# Patient Record
Sex: Male | Born: 1965
Health system: Southern US, Community
[De-identification: ages and names within clinical notes are randomized; demographics above are authoritative.]

## PROBLEM LIST (undated history)

## (undated) DIAGNOSIS — K2210 Ulcer of esophagus without bleeding: Secondary | ICD-10-CM

## (undated) DIAGNOSIS — E119 Type 2 diabetes mellitus without complications: Secondary | ICD-10-CM

## (undated) DIAGNOSIS — I10 Essential (primary) hypertension: Secondary | ICD-10-CM

## (undated) DIAGNOSIS — E7800 Pure hypercholesterolemia, unspecified: Secondary | ICD-10-CM

## (undated) HISTORY — DX: Ulcer of esophagus without bleeding: K22.10

## (undated) HISTORY — PX: HAND SURGERY: SHX662

---

## 2003-04-19 ENCOUNTER — Ambulatory Visit (HOSPITAL_COMMUNITY): Admission: RE | Admit: 2003-04-19 | Discharge: 2003-04-19 | Payer: Self-pay | Admitting: Family Medicine

## 2004-04-15 ENCOUNTER — Ambulatory Visit (HOSPITAL_COMMUNITY): Admission: RE | Admit: 2004-04-15 | Discharge: 2004-04-15 | Payer: Self-pay | Admitting: Family Medicine

## 2004-05-07 ENCOUNTER — Ambulatory Visit (HOSPITAL_COMMUNITY): Admission: RE | Admit: 2004-05-07 | Discharge: 2004-05-07 | Payer: Self-pay | Admitting: Urology

## 2004-05-09 ENCOUNTER — Ambulatory Visit (HOSPITAL_COMMUNITY): Admission: RE | Admit: 2004-05-09 | Discharge: 2004-05-09 | Payer: Self-pay | Admitting: Urology

## 2004-05-13 ENCOUNTER — Ambulatory Visit (HOSPITAL_COMMUNITY): Admission: RE | Admit: 2004-05-13 | Discharge: 2004-05-13 | Payer: Self-pay | Admitting: Urology

## 2004-05-28 ENCOUNTER — Ambulatory Visit (HOSPITAL_COMMUNITY): Admission: RE | Admit: 2004-05-28 | Discharge: 2004-05-28 | Payer: Self-pay | Admitting: Urology

## 2004-05-29 ENCOUNTER — Ambulatory Visit (HOSPITAL_COMMUNITY): Admission: RE | Admit: 2004-05-29 | Discharge: 2004-05-29 | Payer: Self-pay | Admitting: Urology

## 2004-07-01 ENCOUNTER — Ambulatory Visit (HOSPITAL_COMMUNITY): Admission: RE | Admit: 2004-07-01 | Discharge: 2004-07-01 | Payer: Self-pay | Admitting: Urology

## 2004-07-22 ENCOUNTER — Ambulatory Visit (HOSPITAL_COMMUNITY): Admission: RE | Admit: 2004-07-22 | Discharge: 2004-07-22 | Payer: Self-pay | Admitting: Urology

## 2006-07-06 ENCOUNTER — Ambulatory Visit (HOSPITAL_COMMUNITY): Admission: RE | Admit: 2006-07-06 | Discharge: 2006-07-06 | Payer: Self-pay | Admitting: Family Medicine

## 2007-04-15 ENCOUNTER — Ambulatory Visit (HOSPITAL_COMMUNITY): Admission: RE | Admit: 2007-04-15 | Discharge: 2007-04-15 | Payer: Self-pay | Admitting: Family Medicine

## 2009-03-08 ENCOUNTER — Ambulatory Visit (HOSPITAL_COMMUNITY): Admission: RE | Admit: 2009-03-08 | Discharge: 2009-03-08 | Payer: Self-pay | Admitting: Family Medicine

## 2009-12-30 ENCOUNTER — Ambulatory Visit: Payer: Self-pay | Admitting: Cardiology

## 2010-01-20 ENCOUNTER — Ambulatory Visit (HOSPITAL_COMMUNITY): Admission: RE | Admit: 2010-01-20 | Discharge: 2010-01-20 | Payer: Self-pay | Admitting: Family Medicine

## 2010-01-21 ENCOUNTER — Encounter (HOSPITAL_COMMUNITY): Admission: RE | Admit: 2010-01-21 | Discharge: 2010-02-20 | Payer: Self-pay | Admitting: Family Medicine

## 2010-05-03 ENCOUNTER — Encounter: Payer: Self-pay | Admitting: Urology

## 2010-08-29 NOTE — H&P (Signed)
Mario Wise               ACCOUNT NO.:  [id number]   MEDICAL RECORD NO.:  [id number]          PATIENT TYPE:  AMB   LOCATION:  DAY                           FACILITY:  [facility]   PHYSICIAN:  Dennie Maizes, M.D.   DATE OF BIRTH:  Sep 23, 1965   DATE OF ADMISSION:  05/28/2004  DATE OF DISCHARGE:  LH                                HISTORY & PHYSICAL   CHIEF COMPLAINT:  Intermittent gross hematuria, bilateral renal calculi,  large right renal calculus.   HISTORY OF PRESENT ILLNESS:  This 45 year old male was found to have  microhematuria on previous physical examinations.  He experienced  intermittent gross hematuria.  He was referred to me by Dr. Regino Schultze for  further evaluation and management.  His past history revealed urolithiasis.  He has undergone ESL of left renal calculus in the past.  He was evaluated  with a CT scan of the abdomen and pelvis.  This revealed bilateral small  renal calculi.  There was also a large calculus in the right kidney  measuring 11 x 9-mm in size.  The patient is brought to the short stay  center today for an ESL of the right renal calculus.  There is no history of  fever, chills, voiding difficulty, or urinary tract infection.   PAST MEDICAL HISTORY:  1.  Elevated cholesterol.  2.  GERD.  3.  Status post hand surgery in 1988.  4.  History of urolithiasis, status post ESL.   MEDICATIONS:  Vytorin  .   ALLERGIES:  None.   PHYSICAL EXAMINATION:  VITAL SIGNS:  Height 5 foot 10 inches, weight 187  pounds.  HEENT:  Normal.  NECK:  No masses.  LUNGS:  Clear to auscultation.  HEART:  Regular rate and rhythm.  No murmurs.  ABDOMEN:  Soft.  No palpable flank mass.  No costovertebral angle  tenderness.  Bladder not palpable.  GENITOURINARY:  Penis and testes are normal.   IMPRESSION:  1.  Hematuria.  2.  Large right renal calculus, bilateral small renal calculi.   PLAN:  ESL of the right renal calculus with IV sedation in day hospital.  I  have  discussed with the patient regarding the diagnosis, operative details,  alternate treatments, outcome, possible risks and complications and he has  agreed for the procedure to be done.      SK/MEDQ  D:  05/28/2004  T:  05/28/2004  Job:  [id number]   cc:   Kirk Ruths, M.D.  [address]  [place]  [place] [address]  Fax: [phone]   [facility]  Fax: [phone]

## 2010-12-05 ENCOUNTER — Other Ambulatory Visit (HOSPITAL_COMMUNITY): Payer: Self-pay | Admitting: Family Medicine

## 2010-12-05 DIAGNOSIS — R7401 Elevation of levels of liver transaminase levels: Secondary | ICD-10-CM

## 2010-12-11 ENCOUNTER — Ambulatory Visit (HOSPITAL_COMMUNITY): Payer: BC Managed Care – PPO

## 2012-08-01 ENCOUNTER — Emergency Department (HOSPITAL_COMMUNITY)
Admission: EM | Admit: 2012-08-01 | Discharge: 2012-08-01 | Disposition: A | Discharge status: Home / Self Care | Payer: BC Managed Care – PPO | Attending: Emergency Medicine | Admitting: Emergency Medicine

## 2012-08-01 ENCOUNTER — Emergency Department (HOSPITAL_COMMUNITY): Payer: BC Managed Care – PPO

## 2012-08-01 ENCOUNTER — Encounter (HOSPITAL_COMMUNITY): Payer: Self-pay | Admitting: Emergency Medicine

## 2012-08-01 ENCOUNTER — Other Ambulatory Visit: Payer: Self-pay

## 2012-08-01 DIAGNOSIS — R945 Abnormal results of liver function studies: Secondary | ICD-10-CM

## 2012-08-01 DIAGNOSIS — R7989 Other specified abnormal findings of blood chemistry: Secondary | ICD-10-CM | POA: Insufficient documentation

## 2012-08-01 DIAGNOSIS — I10 Essential (primary) hypertension: Secondary | ICD-10-CM | POA: Insufficient documentation

## 2012-08-01 DIAGNOSIS — Z8639 Personal history of other endocrine, nutritional and metabolic disease: Secondary | ICD-10-CM | POA: Insufficient documentation

## 2012-08-01 DIAGNOSIS — Z79899 Other long term (current) drug therapy: Secondary | ICD-10-CM | POA: Insufficient documentation

## 2012-08-01 DIAGNOSIS — Z862 Personal history of diseases of the blood and blood-forming organs and certain disorders involving the immune mechanism: Secondary | ICD-10-CM | POA: Insufficient documentation

## 2012-08-01 DIAGNOSIS — Z7982 Long term (current) use of aspirin: Secondary | ICD-10-CM | POA: Insufficient documentation

## 2012-08-01 DIAGNOSIS — K2100 Gastro-esophageal reflux disease with esophagitis, without bleeding: Secondary | ICD-10-CM

## 2012-08-01 DIAGNOSIS — R110 Nausea: Secondary | ICD-10-CM | POA: Insufficient documentation

## 2012-08-01 DIAGNOSIS — K449 Diaphragmatic hernia without obstruction or gangrene: Secondary | ICD-10-CM | POA: Insufficient documentation

## 2012-08-01 DIAGNOSIS — K219 Gastro-esophageal reflux disease without esophagitis: Secondary | ICD-10-CM | POA: Insufficient documentation

## 2012-08-01 DIAGNOSIS — R42 Dizziness and giddiness: Secondary | ICD-10-CM | POA: Insufficient documentation

## 2012-08-01 HISTORY — DX: Pure hypercholesterolemia, unspecified: E78.00

## 2012-08-01 HISTORY — DX: Essential (primary) hypertension: I10

## 2012-08-01 LAB — COMPREHENSIVE METABOLIC PANEL
BUN: 11 mg/dL (ref 6–23)
Calcium: 9.2 mg/dL (ref 8.4–10.5)
GFR calc Af Amer: >90 mL/min (ref >90)
GFR calc non Af Amer: >90 mL/min (ref >90)
Glucose, Bld: 152 mg/dL — ABNORMAL HIGH (ref 70–99)
Total Protein: 6.9 g/dL (ref 6.0–8.3)

## 2012-08-01 LAB — CBC WITH DIFFERENTIAL/PLATELET
Basophils Absolute: 0.1 10*3/uL (ref 0.0–0.1)
HCT: 43.6 % (ref 39.0–52.0)
Hemoglobin: 15.5 g/dL (ref 13.0–17.0)
Lymphocytes Relative: 35 % (ref 12–46)
Monocytes Absolute: 0.7 10*3/uL (ref 0.1–1.0)
Monocytes Relative: 13 % — ABNORMAL HIGH (ref 3–12)
Neutro Abs: 2.7 10*3/uL (ref 1.7–7.7)
Neutrophils Relative %: 48 % (ref 43–77)
RDW: 12.6 % (ref 11.5–15.5)
WBC: 5.6 10*3/uL (ref 4.0–10.5)

## 2012-08-01 LAB — POCT I-STAT TROPONIN I

## 2012-08-01 MED ORDER — SUCRALFATE 1 G PO TABS: 1.0000 g | ORAL_TABLET | Freq: Four times a day (QID) | ORAL | Status: DC

## 2012-08-01 NOTE — ED Provider Notes (Signed)
History  This chart was scribed for Nelia Shi, MD by Shari Heritage, ED Scribe. The patient was seen in room [facility]. Patient's care was started at 0800.   CSN: [id number]  Arrival date & time 08/01/12  0744   First MD Initiated Contact with Patient 08/01/12 0800      Chief Complaint  Patient presents with  . Chest Pain    The history is provided by the patient. No language interpreter was used.    HPI Comments: Mario Wise is a 47 y.o. male with history of hypertension and high cholesterol who presents to the Emergency Department complaining of waxing and waning, worsening, moderate central chest pain for the past two weeks. There is associated intermittent nausea and lightheadedness. He rates pain as 8/10 right now and 10/10 at most severe. He states that pain is often worse at night and with exertion. He states that pain is more severe with more intense exertion. He says that pain sometimes wakes him up at night. Patient claims that he has "heart flutters" when he lies on his side. He states that he had an intense flare up of pain last night that was improved with sitting upright. Patient had a similar episode of chest pain in 2011 and was worked up for active cardiac disease which included a stress test at [facility], but all results were negative. He hasn't seen a cardiologist since 2011. He has also been worked up for gallbladder disease and other GI issues with no specific diagnosis. Patient has never worn a cardiac monitor. Patient takes Nexium for acid reflux daily. He has no history of diabetes. Patient does not smoke. Patient denies family history of early cardiac related mortalities.   Past Medical History  Diagnosis Date  . Hypertension   . High cholesterol     Past Surgical History  Procedure Laterality Date  . Hand surgery      No family history on file.  History  Substance Use Topics  . Smoking status: Never Smoker   . Smokeless tobacco: Not on file  .  Alcohol Use: Yes     Comment: occasional      Review of Systems A complete 10 system review of systems was obtained and all systems are negative except as noted in the HPI and PMH.   Allergies  Review of patient's allergies indicates no known allergies.  Home Medications   Current Outpatient Rx  Name  Route  Sig  Dispense  Refill  . Ascorbic Acid (VITAMIN C) 1000 MG tablet   Oral   Take 1,000 mg by mouth daily.         Marland Kitchen aspirin EC 81 MG tablet   Oral   Take 81 mg by mouth daily.         . fexofenadine (ALLEGRA) 180 MG tablet   Oral   Take 180 mg by mouth daily.         . sucralfate (CARAFATE) 1 G tablet   Oral   Take 1 tablet (1 g total) by mouth 4 (four) times daily.   30 tablet   1     Triage Vitals: BP 125/85  Pulse 63  Temp(Src) 98.2 F (36.8 C)  Resp 20  Ht 5\' 11"  (1.803 m)  Wt 210 lb (95.255 kg)  BMI 29.3 kg/m2  SpO2 99%  Physical Exam  Nursing note and vitals reviewed. Constitutional: He is oriented to person, place, and time. He appears well-developed and well-nourished. No distress.  HENT:  Head: Normocephalic and atraumatic.  Eyes: Pupils are equal, round, and reactive to light.  Neck: Normal range of motion.  Cardiovascular: Normal rate and intact distal pulses.   Pulmonary/Chest: No respiratory distress.  Abdominal: Normal appearance. He exhibits no distension.  Musculoskeletal: Normal range of motion.  Neurological: He is alert and oriented to person, place, and time. No cranial nerve deficit.  Skin: Skin is warm and dry. No rash noted.  Psychiatric: He has a normal mood and affect. His behavior is normal.    ED Course  Procedures (including critical care time) DIAGNOSTIC STUDIES: Oxygen Saturation is 99% on room air, normal by my interpretation.    COORDINATION OF CARE: 8:12 AM- Patient informed of current plan for treatment and evaluation and agrees with plan at this time.    Date: 08/01/2012  Rate: 66  Rhythm: normal sinus  rhythm  QRS Axis: normal  Intervals: normal  ST/T Wave abnormalities: normal  Conduction Disutrbances: none  Narrative Interpretation: unremarkable       Labs Reviewed  COMPREHENSIVE METABOLIC PANEL - Abnormal; Notable for the following:    Glucose, Bld 152 (*)    AST 39 (*)    ALT 83 (*)    All other components within normal limits  CBC WITH DIFFERENTIAL - Abnormal; Notable for the following:    Monocytes Relative 13 (*)    All other components within normal limits  POCT I-STAT TROPONIN I    Dg Chest 2 View  08/01/2012  *RADIOLOGY REPORT*  Clinical Data: Chest pain, nausea, shortness of breath, history hypertension, GERD  CHEST - 2 VIEW  Comparison: 07/03/2011  Findings: Normal heart size, mediastinal contours, and pulmonary vascularity. Minimal chronic peribronchial thickening. No pulmonary infiltrate, pleural effusion or pneumothorax. Bones unremarkable.  IMPRESSION: Minimal chronic bronchitic changes. No acute abnormalities.   Original Report Authenticated By: Ulyses Southward, M.D.      1. Abnormal LFTs   2. Hiatal hernia   3. Reflux esophagitis       MDM  I personally performed the services described in this documentation, which was scribed in my presence. The recorded information has been reviewed and considered.  I've recommended that he stop the statin medication and followup with repeat LFTs in a week or 2.  Nelia Shi, MD 08/01/12 1029

## 2012-08-01 NOTE — ED Notes (Signed)
Instructions, prescriptions and f/u information given/reviewed - verbalizes understanding.  

## 2012-08-01 NOTE — ED Notes (Signed)
Pt c/o increasing central cp x 2 weeks, worse with exercion.

## 2012-09-23 ENCOUNTER — Encounter (INDEPENDENT_AMBULATORY_CARE_PROVIDER_SITE_OTHER): Payer: Self-pay | Admitting: *Deleted

## 2012-10-18 ENCOUNTER — Ambulatory Visit (INDEPENDENT_AMBULATORY_CARE_PROVIDER_SITE_OTHER): Payer: BC Managed Care – PPO | Admitting: Internal Medicine

## 2012-10-18 ENCOUNTER — Encounter (INDEPENDENT_AMBULATORY_CARE_PROVIDER_SITE_OTHER): Payer: Self-pay | Admitting: Internal Medicine

## 2012-10-18 VITALS — BP 108/56 | HR 60 | Temp 98.1°F | Ht 71.0 in | Wt 216.2 lb

## 2012-10-18 DIAGNOSIS — K219 Gastro-esophageal reflux disease without esophagitis: Secondary | ICD-10-CM | POA: Insufficient documentation

## 2012-10-18 DIAGNOSIS — I10 Essential (primary) hypertension: Secondary | ICD-10-CM | POA: Insufficient documentation

## 2012-10-18 DIAGNOSIS — E7800 Pure hypercholesterolemia, unspecified: Secondary | ICD-10-CM | POA: Insufficient documentation
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
## 2012-10-18 NOTE — Patient Instructions (Signed)
EGD, GERD diet.

## 2012-10-25 ENCOUNTER — Encounter (INDEPENDENT_AMBULATORY_CARE_PROVIDER_SITE_OTHER): Payer: Self-pay | Admitting: *Deleted

## 2012-10-25 ENCOUNTER — Other Ambulatory Visit (INDEPENDENT_AMBULATORY_CARE_PROVIDER_SITE_OTHER): Payer: Self-pay | Admitting: *Deleted

## 2012-10-25 DIAGNOSIS — K219 Gastro-esophageal reflux disease without esophagitis: Secondary | ICD-10-CM

## 2012-10-26 ENCOUNTER — Encounter (HOSPITAL_COMMUNITY): Payer: Self-pay | Admitting: Pharmacy Technician

## 2012-11-02 ENCOUNTER — Encounter (HOSPITAL_COMMUNITY): Payer: Self-pay | Admitting: *Deleted

## 2012-11-02 ENCOUNTER — Encounter (HOSPITAL_COMMUNITY)
Admission: RE | Disposition: A | Discharge status: Home / Self Care | Payer: Self-pay | Source: Ambulatory Visit | Attending: Internal Medicine

## 2012-11-02 ENCOUNTER — Ambulatory Visit (HOSPITAL_COMMUNITY)
Admission: RE | Admit: 2012-11-02 | Discharge: 2012-11-02 | Disposition: A | Discharge status: Home / Self Care | Payer: BC Managed Care – PPO | Source: Ambulatory Visit | Attending: Internal Medicine | Admitting: Internal Medicine

## 2012-11-02 DIAGNOSIS — K219 Gastro-esophageal reflux disease without esophagitis: Secondary | ICD-10-CM

## 2012-11-02 DIAGNOSIS — R079 Chest pain, unspecified: Secondary | ICD-10-CM

## 2012-11-02 DIAGNOSIS — K449 Diaphragmatic hernia without obstruction or gangrene: Secondary | ICD-10-CM | POA: Insufficient documentation

## 2012-11-02 DIAGNOSIS — I10 Essential (primary) hypertension: Secondary | ICD-10-CM | POA: Insufficient documentation

## 2012-11-02 DIAGNOSIS — K2100 Gastro-esophageal reflux disease with esophagitis, without bleeding: Secondary | ICD-10-CM | POA: Insufficient documentation

## 2012-11-02 DIAGNOSIS — K2970 Gastritis, unspecified, without bleeding: Secondary | ICD-10-CM

## 2012-11-02 DIAGNOSIS — K208 Other esophagitis without bleeding: Secondary | ICD-10-CM

## 2012-11-02 DIAGNOSIS — K228 Other specified diseases of esophagus: Secondary | ICD-10-CM

## 2012-11-02 DIAGNOSIS — K2289 Other specified disease of esophagus: Secondary | ICD-10-CM

## 2012-11-02 DIAGNOSIS — K2990 Gastroduodenitis, unspecified, without bleeding: Secondary | ICD-10-CM

## 2012-11-02 HISTORY — PX: ESOPHAGOGASTRODUODENOSCOPY: SHX5428

## 2012-11-02 SURGERY — EGD (ESOPHAGOGASTRODUODENOSCOPY)
Anesthesia: Moderate Sedation

## 2012-11-02 MED ORDER — MEPERIDINE HCL 50 MG/ML IJ SOLN
INTRAMUSCULAR | Status: AC
  Filled 2012-11-02: qty 1

## 2012-11-02 MED ORDER — MIDAZOLAM HCL 5 MG/5ML IJ SOLN
INTRAMUSCULAR | Status: AC
  Filled 2012-11-02: qty 10

## 2012-11-02 MED ORDER — MIDAZOLAM HCL 5 MG/5ML IJ SOLN
INTRAMUSCULAR | Status: DC | PRN
  Administered 2012-11-02 (×3): 2 mg via INTRAVENOUS
  Administered 2012-11-02: 3 mg via INTRAVENOUS

## 2012-11-02 MED ORDER — BUTAMBEN-TETRACAINE-BENZOCAINE 2-2-14 % EX AERO
INHALATION_SPRAY | CUTANEOUS | Status: DC | PRN
  Administered 2012-11-02: 2 via TOPICAL

## 2012-11-02 MED ORDER — ESOMEPRAZOLE MAGNESIUM 40 MG PO CPDR: 40.0000 mg | DELAYED_RELEASE_CAPSULE | Freq: Two times a day (BID) | ORAL | Status: DC

## 2012-11-02 MED ORDER — MEPERIDINE HCL 25 MG/ML IJ SOLN
INTRAMUSCULAR | Status: DC | PRN
  Administered 2012-11-02 (×2): 25 mg via INTRAVENOUS

## 2012-11-02 MED ORDER — STERILE WATER FOR IRRIGATION IR SOLN
Status: DC | PRN
  Administered 2012-11-02: 12:00:00

## 2012-11-02 MED ORDER — SODIUM CHLORIDE 0.9 % IV SOLN
INTRAVENOUS | Status: DC
Start: 2012-11-02 — End: 2012-11-02
  Administered 2012-11-02: 12:00:00 via INTRAVENOUS

## 2012-11-02 NOTE — Op Note (Signed)
EGD PROCEDURE REPORT  PATIENT:  Mario Wise  MR#:  [id number] Birthdate:  1965/10/11, 47 y.o., male Endoscopist:  Dr. Malissa Hippo, MD Referred By:  Dr. Kirk Ruths, MD Procedure Date: 11/02/2012  Procedure:   EGD  Indications:  Patient is 47 year old Caucasian male with 10 year history of GERD now presents with atypical symptoms in the form of chest pain and cough. Noninvasive cardiac evaluation 2 years ago was negative.            Informed Consent:  The risks, benefits, alternatives & imponderables which include, but are not limited to, bleeding, infection, perforation, drug reaction and potential missed lesion have been reviewed.  The potential for biopsy, lesion removal, esophageal dilation, etc. have also been discussed.  Questions have been answered.  All parties agreeable.  Please see history & physical in medical record for more information.  Medications:  Demerol 50 mg IV Versed 9 mg IV Cetacaine spray topically for oropharyngeal anesthesia  Description of procedure:  The endoscope was introduced through the mouth and advanced to the second portion of the duodenum without difficulty or limitations. The mucosal surfaces were surveyed very carefully during advancement of the scope and upon withdrawal.  Findings:  Esophagus:  Mucosa of the esophagus was normal. Serrated or baby GE junction with mucosal edema and single erosion. GEJ:  36 cm Hiatus:  39 cm Stomach:  Stomach was empty and distended very well with insufflation. Folds in the proximal stomach were normal. Examination mucosa and gastric body was normal. There was focal nodularity erythema and erosions at antrum. Pyloric channel was patent. Angularis fundus and cardia were examined by retroflex in the scope and were normal. Duodenum:  Normal bulbar and post bulbar mucosa.  Therapeutic/Diagnostic Maneuvers Performed:  Biopsy taken from GE junction looking for short segment Barrett's.  Complications:   None  Impression: Erosive reflux esophagitis with small sliding hiatal hernia. Serrated or wavy GE junction. Biopsy taken to rule out short segment Barrett's esophagus. Erosive antral gastritis.  Recommendations:  Anti-reflux measures reinforced. Increase Nexium or Esomeprazole to 40 mg by mouth twice a day. H. Pylori serology. Office visit in 8 weeks.  Shauntel Prest U  11/02/2012  12:58 PM  CC: Dr. Kirk Ruths, MD & Dr. Bonnetta Barry ref. provider found
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
## 2012-11-03 LAB — H. PYLORI ANTIBODY, IGG: H Pylori IgG: <0.4 {ISR}

## 2012-11-07 ENCOUNTER — Encounter (HOSPITAL_COMMUNITY): Payer: Self-pay | Admitting: Internal Medicine

## 2012-11-14 ENCOUNTER — Encounter (INDEPENDENT_AMBULATORY_CARE_PROVIDER_SITE_OTHER): Payer: Self-pay | Admitting: *Deleted

## 2012-12-14 ENCOUNTER — Encounter (INDEPENDENT_AMBULATORY_CARE_PROVIDER_SITE_OTHER): Payer: Self-pay | Admitting: *Deleted

## 2013-01-23 ENCOUNTER — Ambulatory Visit (INDEPENDENT_AMBULATORY_CARE_PROVIDER_SITE_OTHER): Payer: BC Managed Care – PPO | Admitting: Internal Medicine

## 2013-02-27 ENCOUNTER — Encounter (INDEPENDENT_AMBULATORY_CARE_PROVIDER_SITE_OTHER): Payer: Self-pay | Admitting: Internal Medicine

## 2013-02-27 ENCOUNTER — Ambulatory Visit (INDEPENDENT_AMBULATORY_CARE_PROVIDER_SITE_OTHER): Payer: BC Managed Care – PPO | Admitting: Internal Medicine

## 2013-02-27 VITALS — BP 120/82 | HR 78 | Temp 97.8°F | Resp 18 | Ht 71.0 in | Wt 221.8 lb

## 2013-02-27 DIAGNOSIS — K2100 Gastro-esophageal reflux disease with esophagitis, without bleeding: Secondary | ICD-10-CM

## 2013-02-27 MED ORDER — SUCRALFATE 1 G PO TABS: 1.0000 g | ORAL_TABLET | Freq: Two times a day (BID) | ORAL | Status: DC

## 2013-02-27 MED ORDER — PANTOPRAZOLE SODIUM 40 MG PO TBEC: 40.0000 mg | DELAYED_RELEASE_TABLET | Freq: Two times a day (BID) | ORAL | Status: DC

## 2013-02-27 NOTE — Progress Notes (Signed)
Presenting complaint;  Followup for GERD.  Subjective:  Patient is 47 year old Caucasian male who presented in July of 2014 with intractable heartburn and epigastric pain. EGD revealed erosive reflux esophagitis, small sliding hiatal hernia and gastritis. Biopsy from GE junction was negative for Barrett's. H. pylori serology was negative. Patient states he has changed his eating habits. He has quit eating [place] food because every time he eats [place] food he gets severe heartburn. He is now taking OTC Nexium since prescription medication cost was too much for him. He is taking sucralfate only twice daily. He has had very few episodes of heartburn since his EGD. He recalls he may have had one episode of regurgitation. He denies epigastric pain melena or rectal bleeding.      Current Medications: Current Outpatient Prescriptions  Medication Sig Dispense Refill  . Ascorbic Acid (VITAMIN C) 1000 MG tablet Take 1,000 mg by mouth daily.      Marland Kitchen aspirin EC 81 MG tablet Take 81 mg by mouth daily.      Marland Kitchen esomeprazole (NEXIUM) 40 MG capsule Take 1 capsule (40 mg total) by mouth 2 (two) times daily before a meal.  60 capsule  1  . fexofenadine (ALLEGRA) 180 MG tablet Take 180 mg by mouth daily.      Marland Kitchen lisinopril (PRINIVIL,ZESTRIL) 10 MG tablet Take by mouth daily.      . sucralfate (CARAFATE) 1 G tablet Take 1 tablet (1 g total) by mouth 4 (four) times daily.  30 tablet  1   No current facility-administered medications for this visit.     Objective: Blood pressure 120/82, pulse 78, temperature 97.8 F (36.6 C), temperature source Oral, resp. rate 18, height 5\' 11"  (1.803 m), weight 221 lb 12.8 oz (100.608 kg). Patient is alert and in no acute distress Conjunctiva is pink. Sclera is nonicteric Oropharyngeal mucosa is normal. No neck masses or thyromegaly noted. Cardiac exam with regular rhythm normal S1 and S2. No murmur or gallop noted. Lungs are clear to auscultation. Abdomen is full but  soft and nontender without organomegaly or masses.  No LE edema or clubbing noted.  Labs/studies Results: H. pylori serology in July 2014 was negative. Biopsy from GE junction in July 2014 was negative for Barrett's.   Assessment:  #1. Erosive reflux esophagitis. Patient is doing very well with current therapy. He is still paying close to $80 a month for OTC PPI and he would therefore be switched to another agent which would be cheaper and hopefully as effective.    Plan:  Discontinue Nexium OTC. Pantoprazole 40 mg by mouth twice a day. Drop sucralfate dose to 1 g by mouth twice a day and after the holidays he could use it on an as-needed basis. Patient will call if pantoprazole does not work in which case we'll try him on lansoprazole. Office visit in one year.

## 2013-02-27 NOTE — Patient Instructions (Signed)
Notify if pantoprazole does not work in which case will try lansoprazole

## 2013-04-25 ENCOUNTER — Other Ambulatory Visit (INDEPENDENT_AMBULATORY_CARE_PROVIDER_SITE_OTHER): Payer: Self-pay | Admitting: Internal Medicine

## 2013-04-25 DIAGNOSIS — K2100 Gastro-esophageal reflux disease with esophagitis, without bleeding: Secondary | ICD-10-CM

## 2013-04-25 MED ORDER — PANTOPRAZOLE SODIUM 40 MG PO TBEC: 40.0000 mg | DELAYED_RELEASE_TABLET | Freq: Two times a day (BID) | ORAL | Status: DC

## 2013-11-29 ENCOUNTER — Encounter (INDEPENDENT_AMBULATORY_CARE_PROVIDER_SITE_OTHER): Payer: Self-pay | Admitting: *Deleted

## 2014-02-27 ENCOUNTER — Ambulatory Visit (INDEPENDENT_AMBULATORY_CARE_PROVIDER_SITE_OTHER): Payer: BC Managed Care – PPO | Admitting: Internal Medicine

## 2014-04-04 ENCOUNTER — Encounter (INDEPENDENT_AMBULATORY_CARE_PROVIDER_SITE_OTHER): Payer: Self-pay | Admitting: *Deleted

## 2014-04-04 ENCOUNTER — Telehealth (INDEPENDENT_AMBULATORY_CARE_PROVIDER_SITE_OTHER): Payer: Self-pay | Admitting: *Deleted

## 2014-04-04 NOTE — Telephone Encounter (Signed)
Mario Wise NO SHOWED for her apt on 02/27/14 with [name], NP. A NS letter has been mailed.

## 2014-05-23 ENCOUNTER — Telehealth (INDEPENDENT_AMBULATORY_CARE_PROVIDER_SITE_OTHER): Payer: Self-pay | Admitting: Internal Medicine

## 2014-05-23 DIAGNOSIS — K2100 Gastro-esophageal reflux disease with esophagitis, without bleeding: Secondary | ICD-10-CM

## 2014-05-23 MED ORDER — PANTOPRAZOLE SODIUM 40 MG PO TBEC: 40.0000 mg | DELAYED_RELEASE_TABLET | Freq: Two times a day (BID) | ORAL | Status: DC

## 2014-05-23 NOTE — Telephone Encounter (Signed)
error 

## 2014-07-30 ENCOUNTER — Other Ambulatory Visit (INDEPENDENT_AMBULATORY_CARE_PROVIDER_SITE_OTHER): Payer: Self-pay | Admitting: Internal Medicine

## 2014-09-28 ENCOUNTER — Other Ambulatory Visit (INDEPENDENT_AMBULATORY_CARE_PROVIDER_SITE_OTHER): Payer: Self-pay | Admitting: Internal Medicine

## 2014-10-01 NOTE — Telephone Encounter (Signed)
Patient needs to make a office appointment prior to further refills. Last seen in the office on 02/27/13.

## 2014-10-04 NOTE — Telephone Encounter (Signed)
Apt has been scheduled for 10/08/14 with Terri Setzer, NP. 

## 2014-10-08 ENCOUNTER — Encounter (INDEPENDENT_AMBULATORY_CARE_PROVIDER_SITE_OTHER): Payer: Self-pay | Admitting: Internal Medicine

## 2014-10-08 ENCOUNTER — Ambulatory Visit (INDEPENDENT_AMBULATORY_CARE_PROVIDER_SITE_OTHER): Payer: BLUE CROSS/BLUE SHIELD | Admitting: Internal Medicine

## 2014-10-08 VITALS — BP 102/64 | HR 72 | Temp 98.1°F | Ht 71.0 in | Wt 217.2 lb

## 2014-10-08 DIAGNOSIS — K208 Other esophagitis: Secondary | ICD-10-CM

## 2014-10-08 DIAGNOSIS — K2210 Ulcer of esophagus without bleeding: Secondary | ICD-10-CM

## 2014-10-08 NOTE — Progress Notes (Signed)
   Subjective:    Patient ID: Mario Wise, male    DOB: 21-May-1965, 49 y.o.   MRN: [id number]  HPI Here today for f/u for his erosive esophagitis.  Taking Protonix BID. He tells me he is doing good. He says the Protonix is much better on his insurance than the Nexium. He says 98% of the time his acid reflux is controlled unless he eats pork. His appetite is good. No weight loss. He says he is not avoiding any particular foods. No abdominal pain. BMs x 1 a day. EGD in 2014 revealed erosive esophagitis.  He will be due to a colonoscopy in January     11/02/2012 EGD: chest pain, atypical:   Impression: Erosive reflux esophagitis with small sliding hiatal hernia. Serrated or wavy GE junction. Biopsy taken to rule out short segment Barrett's esophagus. Erosive antral gastritis.  H. pylori serology is negative. Biopsy from distal esophagus negative for Barrett's. Shows changes of reflux esophagitis Review of Systems  Single, no children.     Past Medical History  Diagnosis Date  . Hypertension   . High cholesterol   . Erosive esophagitis     Past Surgical History  Procedure Laterality Date  . Hand surgery      industrial accident in 1988  . Esophagogastroduodenoscopy N/A 11/02/2012    Procedure: ESOPHAGOGASTRODUODENOSCOPY (EGD);  Surgeon: Malissa Hippo, MD;  Location: [facility];  Service: Endoscopy;  Laterality: N/A;  245-moved to 12:45 Ann to notify pt    No Known Allergies  Current Outpatient Prescriptions on File Prior to Visit  Medication Sig Dispense Refill  . fexofenadine (ALLEGRA) 180 MG tablet Take 180 mg by mouth daily.    Marland Kitchen lisinopril (PRINIVIL,ZESTRIL) 10 MG tablet Take by mouth daily.    . pantoprazole (PROTONIX) 40 MG tablet TAKE 1 TABLET BY MOUTH TWICE A DAY 60 tablet 2  . sucralfate (CARAFATE) 1 G tablet Take 1 tablet (1 g total) by mouth 2 (two) times daily. (Patient taking differently: Take 1 g by mouth as needed. ) 60 tablet 2   No current  facility-administered medications on file prior to visit.     Objective:   Physical ExamBlood pressure 102/64, pulse 72, temperature 98.1 F (36.7 C), height 5\' 11"  (1.803 m), weight 217 lb 3.2 oz (98.521 kg). Alert and oriented. Skin warm and dry. Oral mucosa is moist.   . Sclera anicteric, conjunctivae is pink. Thyroid not enlarged. No cervical lymphadenopathy. Lungs clear. Heart regular rate and rhythm.  Abdomen is soft. Bowel sounds are positive. No hepatomegaly. No abdominal masses felt. No tenderness.  No edema to lower extremities. Patient is alert and oriented.        Assessment & Plan:  Erosive esophagitis. He is doing well. Rarely has acid reflux. [facility] is [facility] in July.  Will put on a recall in January.

## 2014-10-08 NOTE — Patient Instructions (Signed)
Continue the Protonix BID.  Will put on recall colonoscopy in January

## 2015-03-10 ENCOUNTER — Other Ambulatory Visit (INDEPENDENT_AMBULATORY_CARE_PROVIDER_SITE_OTHER): Payer: Self-pay | Admitting: Internal Medicine

## 2015-06-20 ENCOUNTER — Encounter (INDEPENDENT_AMBULATORY_CARE_PROVIDER_SITE_OTHER): Payer: Self-pay | Admitting: *Deleted

## 2016-01-05 ENCOUNTER — Emergency Department (HOSPITAL_COMMUNITY)
Admission: EM | Admit: 2016-01-05 | Discharge: 2016-01-05 | Disposition: A | Discharge status: Home / Self Care | Payer: Commercial Managed Care - PPO | Attending: Emergency Medicine | Admitting: Emergency Medicine

## 2016-01-05 ENCOUNTER — Emergency Department (HOSPITAL_COMMUNITY): Payer: Commercial Managed Care - PPO

## 2016-01-05 ENCOUNTER — Encounter (HOSPITAL_COMMUNITY): Payer: Self-pay | Admitting: Emergency Medicine

## 2016-01-05 DIAGNOSIS — S50312A Abrasion of left elbow, initial encounter: Secondary | ICD-10-CM | POA: Diagnosis not present

## 2016-01-05 DIAGNOSIS — Y9389 Activity, other specified: Secondary | ICD-10-CM | POA: Diagnosis not present

## 2016-01-05 DIAGNOSIS — S91012A Laceration without foreign body, left ankle, initial encounter: Secondary | ICD-10-CM | POA: Insufficient documentation

## 2016-01-05 DIAGNOSIS — Z23 Encounter for immunization: Secondary | ICD-10-CM | POA: Insufficient documentation

## 2016-01-05 DIAGNOSIS — Z79899 Other long term (current) drug therapy: Secondary | ICD-10-CM | POA: Insufficient documentation

## 2016-01-05 DIAGNOSIS — I10 Essential (primary) hypertension: Secondary | ICD-10-CM | POA: Insufficient documentation

## 2016-01-05 DIAGNOSIS — Y92410 Unspecified street and highway as the place of occurrence of the external cause: Secondary | ICD-10-CM | POA: Diagnosis not present

## 2016-01-05 DIAGNOSIS — S51812A Laceration without foreign body of left forearm, initial encounter: Secondary | ICD-10-CM | POA: Diagnosis not present

## 2016-01-05 DIAGNOSIS — S0003XA Contusion of scalp, initial encounter: Secondary | ICD-10-CM | POA: Diagnosis not present

## 2016-01-05 DIAGNOSIS — Y999 Unspecified external cause status: Secondary | ICD-10-CM | POA: Diagnosis not present

## 2016-01-05 DIAGNOSIS — T148XXA Other injury of unspecified body region, initial encounter: Secondary | ICD-10-CM

## 2016-01-05 DIAGNOSIS — S0990XA Unspecified injury of head, initial encounter: Secondary | ICD-10-CM | POA: Diagnosis not present

## 2016-01-05 DIAGNOSIS — S41112A Laceration without foreign body of left upper arm, initial encounter: Secondary | ICD-10-CM

## 2016-01-05 DIAGNOSIS — S99912A Unspecified injury of left ankle, initial encounter: Secondary | ICD-10-CM | POA: Diagnosis present

## 2016-01-05 LAB — COMPREHENSIVE METABOLIC PANEL
ALBUMIN: 4.4 g/dL (ref 3.5–5.0)
ALT: 44 U/L (ref 17–63)
ANION GAP: 10 (ref 5–15)
AST: 38 U/L (ref 15–41)
Alkaline Phosphatase: 44 U/L (ref 38–126)
BUN: 11 mg/dL (ref 6–20)
CO2: 25 mmol/L (ref 22–32)
Calcium: 9.3 mg/dL (ref 8.9–10.3)
Chloride: 103 mmol/L (ref 101–111)
Creatinine, Ser: 0.87 mg/dL (ref 0.61–1.24)
GFR calc non Af Amer: >60 mL/min (ref >60)
GLUCOSE: 108 mg/dL — AB (ref 65–99)
POTASSIUM: 3 mmol/L — AB (ref 3.5–5.1)
SODIUM: 138 mmol/L (ref 135–145)
TOTAL PROTEIN: 7.1 g/dL (ref 6.5–8.1)
Total Bilirubin: 0.7 mg/dL (ref 0.3–1.2)

## 2016-01-05 LAB — CBC WITH DIFFERENTIAL/PLATELET
BASOS PCT: 0 %
Basophils Absolute: 0 10*3/uL (ref 0.0–0.1)
EOS ABS: 0.3 10*3/uL (ref 0.0–0.7)
EOS PCT: 2 %
HCT: 46.4 % (ref 39.0–52.0)
Hemoglobin: 15.7 g/dL (ref 13.0–17.0)
Lymphocytes Relative: 34 %
Lymphs Abs: 3.8 10*3/uL (ref 0.7–4.0)
MCH: 30.8 pg (ref 26.0–34.0)
MCHC: 33.8 g/dL (ref 30.0–36.0)
MCV: 91 fL (ref 78.0–100.0)
MONO ABS: 1.6 10*3/uL — AB (ref 0.1–1.0)
MONOS PCT: 14 %
Neutro Abs: 5.5 10*3/uL (ref 1.7–7.7)
Neutrophils Relative %: 50 %
Platelets: 272 10*3/uL (ref 150–400)
RBC: 5.1 MIL/uL (ref 4.22–5.81)
RDW: 13.4 % (ref 11.5–15.5)
WBC: 11.1 10*3/uL — ABNORMAL HIGH (ref 4.0–10.5)

## 2016-01-05 LAB — TYPE AND SCREEN
ABO/RH(D): A POS
ANTIBODY SCREEN: NEGATIVE

## 2016-01-05 MED ORDER — LIDOCAINE-EPINEPHRINE (PF) 1 %-1:200000 IJ SOLN
INTRAMUSCULAR | Status: AC
  Filled 2016-01-05: qty 30

## 2016-01-05 MED ORDER — BACITRACIN ZINC 500 UNIT/GM EX OINT
TOPICAL_OINTMENT | Freq: Two times a day (BID) | CUTANEOUS | Status: DC
  Filled 2016-01-05: qty 4.5

## 2016-01-05 MED ORDER — TETANUS-DIPHTH-ACELL PERTUSSIS 5-2.5-18.5 LF-MCG/0.5 IM SUSP
0.5000 mL | Freq: Once | INTRAMUSCULAR | Status: AC
  Administered 2016-01-05: 0.5 mL via INTRAMUSCULAR
  Filled 2016-01-05: qty 0.5

## 2016-01-05 MED ORDER — FENTANYL CITRATE (PF) 100 MCG/2ML IJ SOLN
100.0000 ug | Freq: Once | INTRAMUSCULAR | Status: AC
  Administered 2016-01-05: 100 ug via INTRAVENOUS
  Filled 2016-01-05: qty 2

## 2016-01-05 MED ORDER — BACITRACIN ZINC 500 UNIT/GM EX OINT
TOPICAL_OINTMENT | Freq: Two times a day (BID) | CUTANEOUS | Status: DC
  Administered 2016-01-05: 20:00:00 via TOPICAL
  Filled 2016-01-05: qty 4.5

## 2016-01-05 MED ORDER — LIDOCAINE-EPINEPHRINE (PF) 1 %-1:200000 IJ SOLN
20.0000 mL | Freq: Once | INTRAMUSCULAR | Status: AC
  Administered 2016-01-05: 20 mL
  Filled 2016-01-05: qty 20

## 2016-01-05 NOTE — ED Notes (Signed)
Patient transported to CT 

## 2016-01-05 NOTE — ED Notes (Signed)
Patient transported to X-ray 

## 2016-01-05 NOTE — ED Triage Notes (Signed)
Deformity noted to L elbow and arm, possible open fracture.
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
## 2016-01-09 ENCOUNTER — Other Ambulatory Visit (INDEPENDENT_AMBULATORY_CARE_PROVIDER_SITE_OTHER): Payer: Self-pay | Admitting: *Deleted

## 2016-01-09 DIAGNOSIS — Z1211 Encounter for screening for malignant neoplasm of colon: Secondary | ICD-10-CM

## 2016-03-19 ENCOUNTER — Encounter (INDEPENDENT_AMBULATORY_CARE_PROVIDER_SITE_OTHER): Payer: Self-pay | Admitting: *Deleted

## 2016-03-19 ENCOUNTER — Telehealth (INDEPENDENT_AMBULATORY_CARE_PROVIDER_SITE_OTHER): Payer: Self-pay | Admitting: *Deleted

## 2016-03-19 NOTE — Telephone Encounter (Signed)
Patient needs trilyte 

## 2016-03-26 MED ORDER — PEG 3350-KCL-NA BICARB-NACL 420 G PO SOLR: 4000.0000 mL | Freq: Once | ORAL | 0 refills | Status: AC

## 2016-04-10 ENCOUNTER — Telehealth (INDEPENDENT_AMBULATORY_CARE_PROVIDER_SITE_OTHER): Payer: Self-pay | Admitting: *Deleted

## 2016-04-10 NOTE — Telephone Encounter (Signed)
Referring MD/PCP: golding   Procedure: tcs  Reason/Indication:  screening  Has patient had this procedure before?  no  If so, when, by whom and where?    Is there a family history of colon cancer?  no  Who?  What age when diagnosed?    Is patient diabetic?   yes      Does patient have prosthetic heart valve or mechanical valve?  no  Do you have a pacemaker?  no  Has patient ever had endocarditis? no  Has patient had joint replacement within last 12 months?  no  Does patient tend to be constipated or take laxatives? no  Does patient have a history of alcohol/drug use?  no  Is patient on Coumadin, Plavix and/or Aspirin? no  Medications: allergra 180 mg daily, lisinopril 10 mg daily, simvastatin 20 mg daily, glyburide 5 mg daily, actos 15 mg daily, pantoprazole 40 mg daily  Allergies: nkda  Medication Adjustment:   Procedure date & time: 05/06/16 at 830

## 2016-04-14 NOTE — Telephone Encounter (Signed)
agree

## 2016-05-06 ENCOUNTER — Ambulatory Visit (HOSPITAL_COMMUNITY)
Admission: RE | Admit: 2016-05-06 | Discharge: 2016-05-06 | Disposition: A | Discharge status: Home / Self Care | Payer: Commercial Managed Care - PPO | Source: Ambulatory Visit | Attending: Internal Medicine | Admitting: Internal Medicine

## 2016-05-06 ENCOUNTER — Encounter (HOSPITAL_COMMUNITY): Payer: Self-pay | Admitting: *Deleted

## 2016-05-06 ENCOUNTER — Encounter (HOSPITAL_COMMUNITY)
Admission: RE | Disposition: A | Discharge status: Home / Self Care | Payer: Self-pay | Source: Ambulatory Visit | Attending: Internal Medicine

## 2016-05-06 DIAGNOSIS — I10 Essential (primary) hypertension: Secondary | ICD-10-CM | POA: Diagnosis not present

## 2016-05-06 DIAGNOSIS — E7800 Pure hypercholesterolemia, unspecified: Secondary | ICD-10-CM | POA: Diagnosis not present

## 2016-05-06 DIAGNOSIS — Z7984 Long term (current) use of oral hypoglycemic drugs: Secondary | ICD-10-CM | POA: Insufficient documentation

## 2016-05-06 DIAGNOSIS — E119 Type 2 diabetes mellitus without complications: Secondary | ICD-10-CM | POA: Insufficient documentation

## 2016-05-06 DIAGNOSIS — Z87891 Personal history of nicotine dependence: Secondary | ICD-10-CM | POA: Diagnosis not present

## 2016-05-06 DIAGNOSIS — Z79899 Other long term (current) drug therapy: Secondary | ICD-10-CM | POA: Insufficient documentation

## 2016-05-06 DIAGNOSIS — Z1211 Encounter for screening for malignant neoplasm of colon: Secondary | ICD-10-CM | POA: Diagnosis not present

## 2016-05-06 DIAGNOSIS — K648 Other hemorrhoids: Secondary | ICD-10-CM | POA: Insufficient documentation

## 2016-05-06 HISTORY — PX: COLONOSCOPY: SHX5424

## 2016-05-06 HISTORY — DX: Type 2 diabetes mellitus without complications: E11.9

## 2016-05-06 LAB — GLUCOSE, CAPILLARY: Glucose-Capillary: 131 mg/dL — ABNORMAL HIGH (ref 65–99)

## 2016-05-06 SURGERY — COLONOSCOPY
Anesthesia: Moderate Sedation

## 2016-05-06 MED ORDER — MEPERIDINE HCL 50 MG/ML IJ SOLN
INTRAMUSCULAR | Status: DC | PRN
  Administered 2016-05-06 (×2): 25 mg via INTRAVENOUS

## 2016-05-06 MED ORDER — MEPERIDINE HCL 50 MG/ML IJ SOLN
INTRAMUSCULAR | Status: AC
  Filled 2016-05-06: qty 1

## 2016-05-06 MED ORDER — MIDAZOLAM HCL 5 MG/5ML IJ SOLN
INTRAMUSCULAR | Status: DC | PRN
  Administered 2016-05-06 (×3): 2 mg via INTRAVENOUS
  Administered 2016-05-06: 1 mg via INTRAVENOUS

## 2016-05-06 MED ORDER — SODIUM CHLORIDE 0.9 % IV SOLN
INTRAVENOUS | Status: DC
  Administered 2016-05-06: 08:00:00 via INTRAVENOUS

## 2016-05-06 MED ORDER — MIDAZOLAM HCL 5 MG/5ML IJ SOLN
INTRAMUSCULAR | Status: AC
  Filled 2016-05-06: qty 10

## 2016-05-06 NOTE — Discharge Instructions (Signed)
Resume usual medications and diet. °No driving for 24 hours. °Next screening exam in 10 years. ° ° ° °Colonoscopy, Adult, Care After °This sheet gives you information about how to care for yourself after your procedure. Your doctor may also give you more specific instructions. If you have problems or questions, call your doctor. °Follow these instructions at home: °General instructions ° °· For the first 24 hours after the procedure: °? Do not drive or use machinery. °? Do not sign important documents. °? Do not drink alcohol. °? Do your daily activities more slowly than normal. °? Eat foods that are soft and easy to digest. °? Rest often. °· Take over-the-counter or prescription medicines only as told by your doctor. °· It is up to you to get the results of your procedure. Ask your doctor, or the department performing the procedure, when your results will be ready. °To help cramping and bloating: °· Try walking around. °· Put heat on your belly (abdomen) as told by your doctor. Use a heat source that your doctor recommends, such as a moist heat pack or a heating pad. °? Put a towel between your skin and the heat source. °? Leave the heat on for 20-30 minutes. °? Remove the heat if your skin turns bright red. This is especially important if you cannot feel pain, heat, or cold. You can get burned. °Eating and drinking °· Drink enough fluid to keep your pee (urine) clear or pale yellow. °· Return to your normal diet as told by your doctor. Avoid heavy or fried foods that are hard to digest. °· Avoid drinking alcohol for as long as told by your doctor. °Contact a doctor if: °· You have blood in your poop (stool) 2-3 days after the procedure. °Get help right away if: °· You have more than a small amount of blood in your poop. °· You see large clumps of tissue (blood clots) in your poop. °· Your belly is swollen. °· You feel sick to your stomach (nauseous). °· You throw up (vomit). °· You have a fever. °· You have belly  pain that gets worse, and medicine does not help your pain. °This information is not intended to replace advice given to you by your health care provider. Make sure you discuss any questions you have with your health care provider. °Document Released: 05/02/2010 Document Revised: 12/23/2015 Document Reviewed: 12/23/2015 °Elsevier Interactive Patient Education © 2017 [employer] ° ° ° ° °Hemorrhoids °Hemorrhoids are swollen veins in and around the rectum or anus. There are two types of hemorrhoids: °· Internal hemorrhoids. These occur in the veins that are just inside the rectum. They may poke through to the outside and become irritated and painful. °· External hemorrhoids. These occur in the veins that are outside of the anus and can be felt as a painful swelling or hard lump near the anus. ° °Most hemorrhoids do not cause serious problems, and they can be managed with home treatments such as diet and lifestyle changes. If home treatments do not help your symptoms, procedures can be done to shrink or remove the hemorrhoids. °What are the causes? °This condition is caused by increased pressure in the anal area. This pressure may result from various things, including: °· Constipation. °· Straining to have a bowel movement. °· Diarrhea. °· Pregnancy. °· Obesity. °· Sitting for long periods of time. °· Heavy lifting or other activity that causes you to strain. °· Anal sex. ° °What are the signs or symptoms? °Symptoms   of this condition include: °· Pain. °· Anal itching or irritation. °· Rectal bleeding. °· Leakage of stool (feces). °· Anal swelling. °· One or more lumps around the anus. ° °How is this diagnosed? °This condition can often be diagnosed through a visual exam. Other exams or tests may also be done, such as: °· Examination of the rectal area with a gloved hand (digital rectal exam). °· Examination of the anal canal using a small tube (anoscope). °· A blood test, if you have lost a significant amount of  blood. °· A test to look inside the colon (sigmoidoscopy or colonoscopy). ° °How is this treated? °This condition can usually be treated at home. However, various procedures may be done if dietary changes, lifestyle changes, and other home treatments do not help your symptoms. These procedures can help make the hemorrhoids smaller or remove them completely. Some of these procedures involve surgery, and others do not. Common procedures include: °· Rubber band ligation. Rubber bands are placed at the base of the hemorrhoids to cut off the blood supply to them. °· Sclerotherapy. Medicine is injected into the hemorrhoids to shrink them. °· Infrared coagulation. A type of light energy is used to get rid of the hemorrhoids. °· Hemorrhoidectomy surgery. The hemorrhoids are surgically removed, and the veins that supply them are tied off. °· Stapled hemorrhoidopexy surgery. A circular stapling device is used to remove the hemorrhoids and use staples to cut off the blood supply to them. ° °Follow these instructions at home: °Eating and drinking °· Eat foods that have a lot of fiber in them, such as whole grains, beans, nuts, fruits, and vegetables. Ask your health care provider about taking products that have added fiber (fiber supplements). °· Drink enough fluid to keep your urine clear or pale yellow. °Managing pain and swelling °· Take warm sitz baths for 20 minutes, 3-4 times a day to ease pain and discomfort. °· If directed, apply ice to the affected area. Using ice packs between sitz baths may be helpful. °? Put ice in a plastic bag. °? Place a towel between your skin and the bag. °? Leave the ice on for 20 minutes, 2-3 times a day. °General instructions °· Take over-the-counter and prescription medicines only as told by your health care provider. °· Use medicated creams or suppositories as told. °· Exercise regularly. °· Go to the bathroom when you have the urge to have a bowel movement. Do not wait. °· Avoid straining  to have bowel movements. °· Keep the anal area dry and clean. Use wet toilet paper or moist towelettes after a bowel movement. °· Do not sit on the toilet for long periods of time. This increases blood pooling and pain. °Contact a health care provider if: °· You have increasing pain and swelling that are not controlled by treatment or medicine. °· You have uncontrolled bleeding. °· You have difficulty having a bowel movement, or you are unable to have a bowel movement. °· You have pain or inflammation outside the area of the hemorrhoids. °This information is not intended to replace advice given to you by your health care provider. Make sure you discuss any questions you have with your health care provider. °Document Released: 03/27/2000 Document Revised: 08/28/2015 Document Reviewed: 12/12/2014 °Elsevier Interactive Patient Education © 2017 [employer] ° °
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
## 2016-05-06 NOTE — Op Note (Signed)
[facility] Patient Name: Mario Wise Procedure Date: 05/06/2016 8:09 AM MRN: [id number] Date of Birth: 07/04/1965 Attending MD: Lionel DecemberNajeeb Liviya Santini , MD CSN: [id number] Age: 51 Admit Type: Outpatient Procedure:                Colonoscopy Indications:              Screening for colorectal malignant neoplasm Providers:                Lionel DecemberNajeeb Lin Glazier, MD, Edrick Kinsammy Vaught, RN, Dyann Ruddleonya Wilson Referring MD:             Corrie MckusickJohn C. Golding, MD Medicines:                Meperidine 50 mg IV, Midazolam 7 mg IV Complications:            No immediate complications. Estimated Blood Loss:     Estimated blood loss: none. Procedure:                Pre-Anesthesia Assessment:                           - Prior to the procedure, a History and Physical                            was performed, and patient medications and                            allergies were reviewed. The patient's tolerance of                            previous anesthesia was also reviewed. The risks                            and benefits of the procedure and the sedation                            options and risks were discussed with the patient.                            All questions were answered, and informed consent                            was obtained. Prior Anticoagulants: The patient has                            taken no previous anticoagulant or antiplatelet                            agents. ASA Grade Assessment: II - A patient with                            mild systemic disease. After reviewing the risks                            and benefits, the patient was deemed in  satisfactory condition to undergo the procedure.                           After obtaining informed consent, the colonoscope                            was passed under direct vision. Throughout the                            procedure, the patient's blood pressure, pulse, and                            oxygen saturations were  monitored continuously. The                            EC-[id number] ([id number]) scope was introduced through                            the anus and advanced to the the cecum, identified                            by appendiceal orifice and ileocecal valve. The                            colonoscopy was performed without difficulty. The                            patient tolerated the procedure well. The quality                            of the bowel preparation was excellent. The                            ileocecal valve, appendiceal orifice, and rectum                            were photographed. Scope In: 8:31:30 AM Scope Out: 8:46:53 AM Scope Withdrawal Time: 0 hours 7 minutes 27 seconds  Total Procedure Duration: 0 hours 15 minutes 23 seconds  Findings:      The perianal and digital rectal examinations were normal.      The colon (entire examined portion) appeared normal.      Internal hemorrhoids were found during retroflexion. The hemorrhoids       were medium-sized. Impression:               - The entire examined colon is normal.                           - Internal hemorrhoids.                           - No specimens collected. Moderate Sedation:      Moderate (conscious) sedation was administered by the endoscopy nurse       and supervised by the endoscopist. The following parameters were       monitored:  oxygen saturation, heart rate, blood pressure, CO2       capnography and response to care. Total physician intraservice time was       22 minutes. Recommendation:           - Patient has a contact number available for                            emergencies. The signs and symptoms of potential                            delayed complications were discussed with the                            patient. Return to normal activities tomorrow.                            Written discharge instructions were provided to the                            patient.                            - Resume previous diet today.                           - Continue present medications.                           - Repeat colonoscopy in 10 years for screening                            purposes. Procedure Code(s):        --- Professional ---                           [phone], Colonoscopy, flexible; diagnostic, including                            collection of specimen(s) by brushing or washing,                            when performed (separate procedure)                           99152, Moderate sedation services provided by the                            same physician or other qualified health care                            professional performing the diagnostic or                            therapeutic service that the sedation supports,  requiring the presence of an independent trained                            observer to assist in the monitoring of the                            patient's level of consciousness and physiological                            status; initial 15 minutes of intraservice time,                            patient age 51 years or older Diagnosis Code(s):        --- Professional ---                           Z12.11, Encounter for screening for malignant                            neoplasm of colon                           K64.8, Other hemorrhoids CPT copyright 2016 [employer]. All rights reserved. The codes documented in this report are preliminary and upon coder review may  be revised to meet current compliance requirements. Lionel December, MD Lionel December, MD 05/06/2016 8:53:12 AM This report has been signed electronically. Number of Addenda: 0

## 2016-05-07 ENCOUNTER — Encounter (HOSPITAL_COMMUNITY): Payer: Self-pay | Admitting: Internal Medicine

## 2016-05-19 DIAGNOSIS — E119 Type 2 diabetes mellitus without complications: Secondary | ICD-10-CM | POA: Diagnosis not present

## 2016-05-19 DIAGNOSIS — E785 Hyperlipidemia, unspecified: Secondary | ICD-10-CM | POA: Diagnosis not present

## 2016-05-19 DIAGNOSIS — I10 Essential (primary) hypertension: Secondary | ICD-10-CM | POA: Diagnosis not present

## 2016-05-29 DIAGNOSIS — E119 Type 2 diabetes mellitus without complications: Secondary | ICD-10-CM | POA: Diagnosis not present

## 2016-05-29 DIAGNOSIS — Z0000 Encounter for general adult medical examination without abnormal findings: Secondary | ICD-10-CM | POA: Diagnosis not present

## 2016-05-29 DIAGNOSIS — I10 Essential (primary) hypertension: Secondary | ICD-10-CM | POA: Diagnosis not present

## 2016-06-02 DIAGNOSIS — I10 Essential (primary) hypertension: Secondary | ICD-10-CM | POA: Diagnosis not present

## 2016-06-02 DIAGNOSIS — Z0000 Encounter for general adult medical examination without abnormal findings: Secondary | ICD-10-CM | POA: Diagnosis not present

## 2016-06-02 DIAGNOSIS — E119 Type 2 diabetes mellitus without complications: Secondary | ICD-10-CM | POA: Diagnosis not present

## 2016-09-28 DIAGNOSIS — I10 Essential (primary) hypertension: Secondary | ICD-10-CM | POA: Diagnosis not present

## 2016-09-28 DIAGNOSIS — E119 Type 2 diabetes mellitus without complications: Secondary | ICD-10-CM | POA: Diagnosis not present

## 2016-10-06 DIAGNOSIS — E785 Hyperlipidemia, unspecified: Secondary | ICD-10-CM | POA: Diagnosis not present

## 2016-10-06 DIAGNOSIS — I10 Essential (primary) hypertension: Secondary | ICD-10-CM | POA: Diagnosis not present

## 2016-10-06 DIAGNOSIS — E119 Type 2 diabetes mellitus without complications: Secondary | ICD-10-CM | POA: Diagnosis not present

## 2017-04-01 DIAGNOSIS — E785 Hyperlipidemia, unspecified: Secondary | ICD-10-CM | POA: Diagnosis not present

## 2017-04-01 DIAGNOSIS — E119 Type 2 diabetes mellitus without complications: Secondary | ICD-10-CM | POA: Diagnosis not present

## 2017-04-01 DIAGNOSIS — I10 Essential (primary) hypertension: Secondary | ICD-10-CM | POA: Diagnosis not present

## 2017-04-08 DIAGNOSIS — E119 Type 2 diabetes mellitus without complications: Secondary | ICD-10-CM | POA: Diagnosis not present

## 2017-05-10 DIAGNOSIS — E785 Hyperlipidemia, unspecified: Secondary | ICD-10-CM | POA: Diagnosis not present

## 2017-05-10 DIAGNOSIS — E119 Type 2 diabetes mellitus without complications: Secondary | ICD-10-CM | POA: Diagnosis not present

## 2017-05-10 DIAGNOSIS — R945 Abnormal results of liver function studies: Secondary | ICD-10-CM | POA: Diagnosis not present

## 2017-05-10 DIAGNOSIS — I10 Essential (primary) hypertension: Secondary | ICD-10-CM | POA: Diagnosis not present

## 2017-05-14 ENCOUNTER — Other Ambulatory Visit (HOSPITAL_COMMUNITY): Payer: Self-pay | Admitting: Internal Medicine

## 2017-05-14 DIAGNOSIS — R7989 Other specified abnormal findings of blood chemistry: Secondary | ICD-10-CM

## 2017-05-14 DIAGNOSIS — R945 Abnormal results of liver function studies: Principal | ICD-10-CM

## 2017-05-28 ENCOUNTER — Ambulatory Visit (HOSPITAL_COMMUNITY): Payer: Commercial Managed Care - PPO

## 2017-05-31 ENCOUNTER — Ambulatory Visit (HOSPITAL_COMMUNITY)
Admission: RE | Admit: 2017-05-31 | Discharge: 2017-05-31 | Disposition: A | Discharge status: Home / Self Care | Payer: 59 | Source: Ambulatory Visit | Attending: Internal Medicine | Admitting: Internal Medicine

## 2017-05-31 DIAGNOSIS — R7989 Other specified abnormal findings of blood chemistry: Secondary | ICD-10-CM | POA: Diagnosis not present

## 2017-05-31 DIAGNOSIS — R932 Abnormal findings on diagnostic imaging of liver and biliary tract: Secondary | ICD-10-CM | POA: Diagnosis not present

## 2017-05-31 DIAGNOSIS — R945 Abnormal results of liver function studies: Secondary | ICD-10-CM | POA: Insufficient documentation

## 2017-06-10 ENCOUNTER — Other Ambulatory Visit: Payer: Self-pay | Admitting: Internal Medicine

## 2017-06-10 DIAGNOSIS — R932 Abnormal findings on diagnostic imaging of liver and biliary tract: Secondary | ICD-10-CM

## 2017-06-16 ENCOUNTER — Other Ambulatory Visit (HOSPITAL_COMMUNITY): Payer: Self-pay | Admitting: Internal Medicine

## 2017-06-16 DIAGNOSIS — R932 Abnormal findings on diagnostic imaging of liver and biliary tract: Secondary | ICD-10-CM

## 2017-06-22 DIAGNOSIS — E785 Hyperlipidemia, unspecified: Secondary | ICD-10-CM | POA: Diagnosis not present

## 2017-06-22 DIAGNOSIS — E119 Type 2 diabetes mellitus without complications: Secondary | ICD-10-CM | POA: Diagnosis not present

## 2017-06-22 DIAGNOSIS — I10 Essential (primary) hypertension: Secondary | ICD-10-CM | POA: Diagnosis not present

## 2017-06-23 ENCOUNTER — Ambulatory Visit (HOSPITAL_COMMUNITY)
Admission: RE | Admit: 2017-06-23 | Discharge: 2017-06-23 | Disposition: A | Discharge status: Home / Self Care | Payer: 59 | Source: Ambulatory Visit | Attending: Internal Medicine | Admitting: Internal Medicine

## 2017-06-23 DIAGNOSIS — K760 Fatty (change of) liver, not elsewhere classified: Secondary | ICD-10-CM | POA: Insufficient documentation

## 2017-06-23 DIAGNOSIS — K7689 Other specified diseases of liver: Secondary | ICD-10-CM | POA: Diagnosis not present

## 2017-06-23 DIAGNOSIS — R932 Abnormal findings on diagnostic imaging of liver and biliary tract: Secondary | ICD-10-CM

## 2017-06-23 LAB — POCT I-STAT CREATININE: Creatinine, Ser: 0.9 mg/dL (ref 0.61–1.24)

## 2017-06-23 MED ORDER — GADOBENATE DIMEGLUMINE 529 MG/ML IV SOLN
20.0000 mL | Freq: Once | INTRAVENOUS | Status: AC | PRN
  Administered 2017-06-23: 20 mL via INTRAVENOUS

## 2017-06-24 DIAGNOSIS — I10 Essential (primary) hypertension: Secondary | ICD-10-CM | POA: Diagnosis not present

## 2017-06-24 DIAGNOSIS — E119 Type 2 diabetes mellitus without complications: Secondary | ICD-10-CM | POA: Diagnosis not present

## 2017-06-24 DIAGNOSIS — E785 Hyperlipidemia, unspecified: Secondary | ICD-10-CM | POA: Diagnosis not present
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
## 2017-09-23 DIAGNOSIS — E119 Type 2 diabetes mellitus without complications: Secondary | ICD-10-CM | POA: Diagnosis not present

## 2017-09-23 DIAGNOSIS — E785 Hyperlipidemia, unspecified: Secondary | ICD-10-CM | POA: Diagnosis not present

## 2017-10-04 DIAGNOSIS — E1169 Type 2 diabetes mellitus with other specified complication: Secondary | ICD-10-CM | POA: Diagnosis not present

## 2017-10-04 DIAGNOSIS — Z0000 Encounter for general adult medical examination without abnormal findings: Secondary | ICD-10-CM | POA: Diagnosis not present

## 2017-10-04 DIAGNOSIS — I10 Essential (primary) hypertension: Secondary | ICD-10-CM | POA: Diagnosis not present

## 2017-11-25 DIAGNOSIS — E113292 Type 2 diabetes mellitus with mild nonproliferative diabetic retinopathy without macular edema, left eye: Secondary | ICD-10-CM | POA: Diagnosis not present

## 2017-11-25 DIAGNOSIS — H5202 Hypermetropia, left eye: Secondary | ICD-10-CM | POA: Diagnosis not present

## 2017-11-25 DIAGNOSIS — H52222 Regular astigmatism, left eye: Secondary | ICD-10-CM | POA: Diagnosis not present

## 2018-01-31 DIAGNOSIS — I10 Essential (primary) hypertension: Secondary | ICD-10-CM | POA: Diagnosis not present

## 2018-01-31 DIAGNOSIS — E1169 Type 2 diabetes mellitus with other specified complication: Secondary | ICD-10-CM | POA: Diagnosis not present

## 2018-01-31 DIAGNOSIS — E119 Type 2 diabetes mellitus without complications: Secondary | ICD-10-CM | POA: Diagnosis not present

## 2018-01-31 DIAGNOSIS — E785 Hyperlipidemia, unspecified: Secondary | ICD-10-CM | POA: Diagnosis not present

## 2018-02-07 DIAGNOSIS — E782 Mixed hyperlipidemia: Secondary | ICD-10-CM | POA: Diagnosis not present

## 2018-02-07 DIAGNOSIS — I10 Essential (primary) hypertension: Secondary | ICD-10-CM | POA: Diagnosis not present

## 2018-02-07 DIAGNOSIS — Z23 Encounter for immunization: Secondary | ICD-10-CM | POA: Diagnosis not present

## 2018-02-07 DIAGNOSIS — E1169 Type 2 diabetes mellitus with other specified complication: Secondary | ICD-10-CM | POA: Diagnosis not present

## 2018-05-17 DIAGNOSIS — E1169 Type 2 diabetes mellitus with other specified complication: Secondary | ICD-10-CM | POA: Diagnosis not present

## 2018-05-17 DIAGNOSIS — I10 Essential (primary) hypertension: Secondary | ICD-10-CM | POA: Diagnosis not present

## 2018-05-17 DIAGNOSIS — E785 Hyperlipidemia, unspecified: Secondary | ICD-10-CM | POA: Diagnosis not present

## 2018-05-17 DIAGNOSIS — E119 Type 2 diabetes mellitus without complications: Secondary | ICD-10-CM | POA: Diagnosis not present

## 2018-05-20 DIAGNOSIS — E1169 Type 2 diabetes mellitus with other specified complication: Secondary | ICD-10-CM | POA: Diagnosis not present

## 2018-05-20 DIAGNOSIS — I10 Essential (primary) hypertension: Secondary | ICD-10-CM | POA: Diagnosis not present

## 2018-05-20 DIAGNOSIS — R809 Proteinuria, unspecified: Secondary | ICD-10-CM | POA: Diagnosis not present

## 2020-05-06 DIAGNOSIS — E6609 Other obesity due to excess calories: Secondary | ICD-10-CM | POA: Diagnosis not present

## 2020-05-06 DIAGNOSIS — E1169 Type 2 diabetes mellitus with other specified complication: Secondary | ICD-10-CM | POA: Diagnosis not present

## 2020-05-06 DIAGNOSIS — K219 Gastro-esophageal reflux disease without esophagitis: Secondary | ICD-10-CM | POA: Diagnosis not present

## 2020-05-06 DIAGNOSIS — E785 Hyperlipidemia, unspecified: Secondary | ICD-10-CM | POA: Diagnosis not present

## 2020-05-06 DIAGNOSIS — I10 Essential (primary) hypertension: Secondary | ICD-10-CM | POA: Diagnosis not present

## 2020-05-06 DIAGNOSIS — E119 Type 2 diabetes mellitus without complications: Secondary | ICD-10-CM | POA: Diagnosis not present

## 2020-05-30 ENCOUNTER — Other Ambulatory Visit: Payer: Self-pay

## 2020-05-30 ENCOUNTER — Ambulatory Visit: Payer: Federal, State, Local not specified - PPO | Admitting: Podiatry

## 2020-05-30 DIAGNOSIS — G5761 Lesion of plantar nerve, right lower limb: Secondary | ICD-10-CM

## 2020-05-30 NOTE — Patient Instructions (Signed)

## 2020-06-02 ENCOUNTER — Encounter: Payer: Self-pay | Admitting: Podiatry

## 2020-06-02 NOTE — Progress Notes (Signed)
  Subjective:  Patient ID: Mario Wise, male    DOB: 1966-01-17,  MRN: [id number]  Chief Complaint  Patient presents with  . Diabetes    Diabetic foot exam- pt was referred by PCP wanting him to get foot exam done- also was DX with neuropathy RT foot- numbness - further evaluation     55 y.o. male presents with the above complaint. History confirmed with patient.   Objective:  Physical Exam: warm, good capillary refill, no trophic changes or ulcerative lesions, normal DP and PT pulses, normal monofilament exam and normal sensory exam.  Normal light touch sensation, he does have pain with palpation of the right second interspace with radiation into the digits Assessment:   1. 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  Patient educated on diabetes. Discussed proper diabetic foot care and discussed risks and complications of disease. Educated patient in depth on reasons to return to the office immediately should he/she discover anything concerning or new on the feet. All questions answered. Discussed proper shoes as well.   I discussed with him that his symptoms are more likely consistent with a neuroma of the right second interspace.  We discussed treatment options in detail including padding, offloading, injection therapy and indications for surgery.  Today recommended injection with sclerosing alcohol, think we should tried 4-5 of these injections to see if they offer any relief in a series, if they are not successful we could try a steroid injection.  Return in about 3 weeks (around 06/20/2020) for neuroma injection.

## 2020-06-25 ENCOUNTER — Ambulatory Visit: Payer: Federal, State, Local not specified - PPO | Admitting: Podiatry

## 2020-06-25 ENCOUNTER — Other Ambulatory Visit: Payer: Self-pay

## 2020-06-25 DIAGNOSIS — M7741 Metatarsalgia, right foot: Secondary | ICD-10-CM

## 2020-06-25 DIAGNOSIS — G5761 Lesion of plantar nerve, right lower limb: Secondary | ICD-10-CM

## 2020-06-25 DIAGNOSIS — E119 Type 2 diabetes mellitus without complications: Secondary | ICD-10-CM | POA: Diagnosis not present

## 2020-06-25 DIAGNOSIS — M2142 Flat foot [pes planus] (acquired), left foot: Secondary | ICD-10-CM

## 2020-06-25 DIAGNOSIS — M2141 Flat foot [pes planus] (acquired), right foot: Secondary | ICD-10-CM | POA: Diagnosis not present

## 2020-06-25 DIAGNOSIS — M7742 Metatarsalgia, left foot: Secondary | ICD-10-CM | POA: Diagnosis not present

## 2020-06-26 ENCOUNTER — Encounter: Payer: Self-pay | Admitting: Podiatry

## 2020-06-26 NOTE — Progress Notes (Signed)
  Subjective:  Patient ID: Mario Wise, male    DOB: 08/18/1965,  MRN: [id number]  Chief Complaint  Patient presents with  . Neuroma    PT stated that he is doing well and his foot feels better, he has no concerns at this time    55 y.o. male returns with the above complaint. History confirmed with patient.  He is doing much better after his last injection.  He has some pain that is intermittent, he does complain of diffuse pain across the ball of the foot.  He works on his feet on hard concrete floors and steel toed shoes.  Objective:  Physical Exam: warm, good capillary refill, no trophic changes or ulcerative lesions, normal DP and PT pulses, normal monofilament exam and normal sensory exam.  Minimal pain with right second, he does have tenderness over the metatarsal pad bilaterally Assessment:   1. 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 Type 2 diabetes mellitus without complication, without long-term current use of insulin (HCC)   3. Metatarsalgia of both feet   4. Pes planus of both feet      Plan:  Patient was evaluated and treated and all questions answered.  Patient educated on diabetes. Discussed proper diabetic foot care and discussed risks and complications of disease. Educated patient in depth on reasons to return to the office immediately should he/she discover anything concerning or new on the feet. All questions answered. Discussed proper shoes as well.   I neuroma seems to be under control at this point, he is doing much better after a single sclerosing alcohol injection.  If this becomes really painful again he should return for further injection therapy  I think that his neuroma pain as well as his general metatarsalgia secondary to his overall foot shape as well as a demanding job that he is on his feet are concrete floors and steel toed shoes.  I think he would benefit from orthotic support from an accommodative insole with a metatarsal pad.  He was casted  today for custom molded orthoses with metatarsal pads  Return in about 2 weeks (around 07/09/2020) for cast change.

## 2020-07-17 DIAGNOSIS — E113293 Type 2 diabetes mellitus with mild nonproliferative diabetic retinopathy without macular edema, bilateral: Secondary | ICD-10-CM | POA: Diagnosis not present

## 2020-08-13 DIAGNOSIS — E119 Type 2 diabetes mellitus without complications: Secondary | ICD-10-CM | POA: Diagnosis not present

## 2020-08-13 DIAGNOSIS — R945 Abnormal results of liver function studies: Secondary | ICD-10-CM | POA: Diagnosis not present

## 2020-08-13 DIAGNOSIS — I10 Essential (primary) hypertension: Secondary | ICD-10-CM | POA: Diagnosis not present

## 2020-08-13 DIAGNOSIS — E1169 Type 2 diabetes mellitus with other specified complication: Secondary | ICD-10-CM | POA: Diagnosis not present

## 2020-08-13 DIAGNOSIS — E785 Hyperlipidemia, unspecified: Secondary | ICD-10-CM | POA: Diagnosis not present

## 2020-08-15 DIAGNOSIS — E669 Obesity, unspecified: Secondary | ICD-10-CM | POA: Diagnosis not present

## 2020-08-15 DIAGNOSIS — I10 Essential (primary) hypertension: Secondary | ICD-10-CM | POA: Diagnosis not present

## 2020-08-15 DIAGNOSIS — E785 Hyperlipidemia, unspecified: Secondary | ICD-10-CM | POA: Diagnosis not present

## 2020-08-15 DIAGNOSIS — K219 Gastro-esophageal reflux disease without esophagitis: Secondary | ICD-10-CM | POA: Diagnosis not present

## 2020-12-12 DIAGNOSIS — I10 Essential (primary) hypertension: Secondary | ICD-10-CM | POA: Diagnosis not present

## 2020-12-12 DIAGNOSIS — R809 Proteinuria, unspecified: Secondary | ICD-10-CM | POA: Diagnosis not present

## 2020-12-12 DIAGNOSIS — E785 Hyperlipidemia, unspecified: Secondary | ICD-10-CM | POA: Diagnosis not present

## 2020-12-18 DIAGNOSIS — E785 Hyperlipidemia, unspecified: Secondary | ICD-10-CM | POA: Diagnosis not present

## 2020-12-18 DIAGNOSIS — I10 Essential (primary) hypertension: Secondary | ICD-10-CM | POA: Diagnosis not present

## 2020-12-18 DIAGNOSIS — K760 Fatty (change of) liver, not elsewhere classified: Secondary | ICD-10-CM | POA: Diagnosis not present

## 2020-12-18 DIAGNOSIS — E1165 Type 2 diabetes mellitus with hyperglycemia: Secondary | ICD-10-CM | POA: Diagnosis not present

## 2021-03-14 DIAGNOSIS — I10 Essential (primary) hypertension: Secondary | ICD-10-CM | POA: Diagnosis not present

## 2021-03-14 DIAGNOSIS — E1165 Type 2 diabetes mellitus with hyperglycemia: Secondary | ICD-10-CM | POA: Diagnosis not present

## 2021-03-19 DIAGNOSIS — I10 Essential (primary) hypertension: Secondary | ICD-10-CM | POA: Diagnosis not present

## 2021-03-19 DIAGNOSIS — E785 Hyperlipidemia, unspecified: Secondary | ICD-10-CM | POA: Diagnosis not present

## 2021-03-19 DIAGNOSIS — Z23 Encounter for immunization: Secondary | ICD-10-CM | POA: Diagnosis not present

## 2021-03-19 DIAGNOSIS — K760 Fatty (change of) liver, not elsewhere classified: Secondary | ICD-10-CM | POA: Diagnosis not present

## 2021-03-19 DIAGNOSIS — E1165 Type 2 diabetes mellitus with hyperglycemia: Secondary | ICD-10-CM | POA: Diagnosis not present

## 2021-06-03 DIAGNOSIS — I10 Essential (primary) hypertension: Secondary | ICD-10-CM | POA: Diagnosis not present

## 2021-06-03 DIAGNOSIS — E1165 Type 2 diabetes mellitus with hyperglycemia: Secondary | ICD-10-CM | POA: Diagnosis not present

## 2021-06-06 DIAGNOSIS — E1165 Type 2 diabetes mellitus with hyperglycemia: Secondary | ICD-10-CM | POA: Diagnosis not present

## 2021-06-06 DIAGNOSIS — I10 Essential (primary) hypertension: Secondary | ICD-10-CM | POA: Diagnosis not present

## 2021-06-06 DIAGNOSIS — E785 Hyperlipidemia, unspecified: Secondary | ICD-10-CM | POA: Diagnosis not present

## 2021-06-06 DIAGNOSIS — K760 Fatty (change of) liver, not elsewhere classified: Secondary | ICD-10-CM | POA: Diagnosis not present

## 2021-07-21 DIAGNOSIS — E119 Type 2 diabetes mellitus without complications: Secondary | ICD-10-CM | POA: Diagnosis not present

## 2021-09-05 DIAGNOSIS — E1165 Type 2 diabetes mellitus with hyperglycemia: Secondary | ICD-10-CM | POA: Diagnosis not present

## 2021-09-05 DIAGNOSIS — E785 Hyperlipidemia, unspecified: Secondary | ICD-10-CM | POA: Diagnosis not present

## 2021-09-05 DIAGNOSIS — I10 Essential (primary) hypertension: Secondary | ICD-10-CM | POA: Diagnosis not present

## 2021-09-11 DIAGNOSIS — E1165 Type 2 diabetes mellitus with hyperglycemia: Secondary | ICD-10-CM | POA: Diagnosis not present

## 2021-09-11 DIAGNOSIS — K760 Fatty (change of) liver, not elsewhere classified: Secondary | ICD-10-CM | POA: Diagnosis not present

## 2021-09-11 DIAGNOSIS — E785 Hyperlipidemia, unspecified: Secondary | ICD-10-CM | POA: Diagnosis not present

## 2021-09-11 DIAGNOSIS — I10 Essential (primary) hypertension: Secondary | ICD-10-CM | POA: Diagnosis not present

## 2021-12-08 DIAGNOSIS — K080 Exfoliation of teeth due to systemic causes: Secondary | ICD-10-CM | POA: Diagnosis not present

## 2021-12-17 DIAGNOSIS — E1165 Type 2 diabetes mellitus with hyperglycemia: Secondary | ICD-10-CM | POA: Diagnosis not present

## 2021-12-17 DIAGNOSIS — I10 Essential (primary) hypertension: Secondary | ICD-10-CM | POA: Diagnosis not present

## 2021-12-22 DIAGNOSIS — K760 Fatty (change of) liver, not elsewhere classified: Secondary | ICD-10-CM | POA: Diagnosis not present

## 2021-12-22 DIAGNOSIS — I10 Essential (primary) hypertension: Secondary | ICD-10-CM | POA: Diagnosis not present

## 2021-12-22 DIAGNOSIS — E785 Hyperlipidemia, unspecified: Secondary | ICD-10-CM | POA: Diagnosis not present

## 2021-12-22 DIAGNOSIS — E1165 Type 2 diabetes mellitus with hyperglycemia: Secondary | ICD-10-CM | POA: Diagnosis not present

## 2022-02-02 DIAGNOSIS — L570 Actinic keratosis: Secondary | ICD-10-CM | POA: Diagnosis not present

## 2022-02-02 DIAGNOSIS — D485 Neoplasm of uncertain behavior of skin: Secondary | ICD-10-CM | POA: Diagnosis not present

## 2022-02-02 DIAGNOSIS — X32XXXA Exposure to sunlight, initial encounter: Secondary | ICD-10-CM | POA: Diagnosis not present

## 2022-02-02 DIAGNOSIS — D225 Melanocytic nevi of trunk: Secondary | ICD-10-CM | POA: Diagnosis not present

## 2022-02-02 DIAGNOSIS — Z1283 Encounter for screening for malignant neoplasm of skin: Secondary | ICD-10-CM | POA: Diagnosis not present

## 2022-03-19 DIAGNOSIS — Z125 Encounter for screening for malignant neoplasm of prostate: Secondary | ICD-10-CM | POA: Diagnosis not present

## 2022-03-19 DIAGNOSIS — I10 Essential (primary) hypertension: Secondary | ICD-10-CM | POA: Diagnosis not present

## 2022-03-19 DIAGNOSIS — E1165 Type 2 diabetes mellitus with hyperglycemia: Secondary | ICD-10-CM | POA: Diagnosis not present

## 2022-03-24 DIAGNOSIS — Z23 Encounter for immunization: Secondary | ICD-10-CM | POA: Diagnosis not present

## 2022-03-24 DIAGNOSIS — K760 Fatty (change of) liver, not elsewhere classified: Secondary | ICD-10-CM | POA: Diagnosis not present

## 2022-03-24 DIAGNOSIS — I10 Essential (primary) hypertension: Secondary | ICD-10-CM | POA: Diagnosis not present

## 2022-03-24 DIAGNOSIS — E1165 Type 2 diabetes mellitus with hyperglycemia: Secondary | ICD-10-CM | POA: Diagnosis not present

## 2022-03-24 DIAGNOSIS — E785 Hyperlipidemia, unspecified: Secondary | ICD-10-CM | POA: Diagnosis not present

## 2022-07-08 DIAGNOSIS — I10 Essential (primary) hypertension: Secondary | ICD-10-CM | POA: Diagnosis not present

## 2022-07-08 DIAGNOSIS — Z125 Encounter for screening for malignant neoplasm of prostate: Secondary | ICD-10-CM | POA: Diagnosis not present

## 2022-07-08 DIAGNOSIS — E1165 Type 2 diabetes mellitus with hyperglycemia: Secondary | ICD-10-CM | POA: Diagnosis not present

## 2022-07-14 DIAGNOSIS — E1165 Type 2 diabetes mellitus with hyperglycemia: Secondary | ICD-10-CM | POA: Diagnosis not present

## 2022-07-14 DIAGNOSIS — I10 Essential (primary) hypertension: Secondary | ICD-10-CM | POA: Diagnosis not present

## 2022-07-14 DIAGNOSIS — E785 Hyperlipidemia, unspecified: Secondary | ICD-10-CM | POA: Diagnosis not present

## 2022-07-14 DIAGNOSIS — K760 Fatty (change of) liver, not elsewhere classified: Secondary | ICD-10-CM | POA: Diagnosis not present

## 2022-11-11 DIAGNOSIS — I10 Essential (primary) hypertension: Secondary | ICD-10-CM | POA: Diagnosis not present

## 2022-11-11 DIAGNOSIS — Z125 Encounter for screening for malignant neoplasm of prostate: Secondary | ICD-10-CM | POA: Diagnosis not present

## 2022-11-11 DIAGNOSIS — E1165 Type 2 diabetes mellitus with hyperglycemia: Secondary | ICD-10-CM | POA: Diagnosis not present

## 2022-11-20 DIAGNOSIS — E785 Hyperlipidemia, unspecified: Secondary | ICD-10-CM | POA: Diagnosis not present

## 2022-11-20 DIAGNOSIS — K760 Fatty (change of) liver, not elsewhere classified: Secondary | ICD-10-CM | POA: Diagnosis not present

## 2022-11-20 DIAGNOSIS — I10 Essential (primary) hypertension: Secondary | ICD-10-CM | POA: Diagnosis not present

## 2022-11-20 DIAGNOSIS — E1165 Type 2 diabetes mellitus with hyperglycemia: Secondary | ICD-10-CM | POA: Diagnosis not present

## 2023-05-18 DIAGNOSIS — E1165 Type 2 diabetes mellitus with hyperglycemia: Secondary | ICD-10-CM | POA: Diagnosis not present

## 2023-05-18 DIAGNOSIS — I10 Essential (primary) hypertension: Secondary | ICD-10-CM | POA: Diagnosis not present

## 2023-05-24 DIAGNOSIS — I10 Essential (primary) hypertension: Secondary | ICD-10-CM | POA: Diagnosis not present

## 2023-05-24 DIAGNOSIS — E785 Hyperlipidemia, unspecified: Secondary | ICD-10-CM | POA: Diagnosis not present

## 2023-05-24 DIAGNOSIS — E1165 Type 2 diabetes mellitus with hyperglycemia: Secondary | ICD-10-CM | POA: Diagnosis not present

## 2023-05-24 DIAGNOSIS — K760 Fatty (change of) liver, not elsewhere classified: Secondary | ICD-10-CM | POA: Diagnosis not present

## 2023-11-24 DIAGNOSIS — E1165 Type 2 diabetes mellitus with hyperglycemia: Secondary | ICD-10-CM | POA: Diagnosis not present

## 2023-11-24 DIAGNOSIS — I10 Essential (primary) hypertension: Secondary | ICD-10-CM | POA: Diagnosis not present

## 2023-11-30 DIAGNOSIS — Z0001 Encounter for general adult medical examination with abnormal findings: Secondary | ICD-10-CM | POA: Diagnosis not present

## 2023-11-30 DIAGNOSIS — I10 Essential (primary) hypertension: Secondary | ICD-10-CM | POA: Diagnosis not present

## 2023-11-30 DIAGNOSIS — K760 Fatty (change of) liver, not elsewhere classified: Secondary | ICD-10-CM | POA: Diagnosis not present

## 2023-11-30 DIAGNOSIS — E785 Hyperlipidemia, unspecified: Secondary | ICD-10-CM | POA: Diagnosis not present

## 2023-11-30 DIAGNOSIS — E1165 Type 2 diabetes mellitus with hyperglycemia: Secondary | ICD-10-CM | POA: Diagnosis not present
